# Patient Record
Sex: Female | Born: 1960 | Race: Black or African American | Hispanic: No | Smoking: Never smoker
Health system: Southern US, Community
[De-identification: ages and names within clinical notes are randomized; demographics above are authoritative.]

## PROBLEM LIST (undated history)

## (undated) DIAGNOSIS — E7800 Pure hypercholesterolemia, unspecified: Secondary | ICD-10-CM

## (undated) HISTORY — PX: FRACTURE SURGERY: SHX138

---

## 2005-06-07 ENCOUNTER — Ambulatory Visit (HOSPITAL_COMMUNITY): Admission: RE | Admit: 2005-06-07 | Discharge: 2005-06-07 | Payer: Self-pay | Admitting: *Deleted

## 2006-09-25 ENCOUNTER — Ambulatory Visit (HOSPITAL_COMMUNITY): Admission: RE | Admit: 2006-09-25 | Discharge: 2006-09-25 | Payer: Self-pay | Admitting: Obstetrics & Gynecology

## 2007-09-12 ENCOUNTER — Ambulatory Visit (HOSPITAL_COMMUNITY): Admission: RE | Admit: 2007-09-12 | Discharge: 2007-09-12 | Payer: Self-pay | Admitting: Ophthalmology

## 2008-07-03 ENCOUNTER — Ambulatory Visit (HOSPITAL_COMMUNITY): Admission: RE | Admit: 2008-07-03 | Discharge: 2008-07-03 | Payer: Self-pay | Admitting: Family Medicine

## 2008-07-14 ENCOUNTER — Encounter: Admission: RE | Admit: 2008-07-14 | Discharge: 2008-07-14 | Payer: Self-pay | Admitting: Family Medicine

## 2009-07-23 ENCOUNTER — Other Ambulatory Visit: Admission: RE | Admit: 2009-07-23 | Discharge: 2009-07-23 | Payer: Self-pay | Admitting: Family Medicine

## 2009-08-09 ENCOUNTER — Ambulatory Visit (HOSPITAL_COMMUNITY): Admission: RE | Admit: 2009-08-09 | Discharge: 2009-08-09 | Payer: Self-pay | Admitting: Family Medicine

## 2010-04-06 ENCOUNTER — Ambulatory Visit: Payer: Self-pay | Admitting: Obstetrics and Gynecology

## 2010-04-06 ENCOUNTER — Inpatient Hospital Stay (HOSPITAL_COMMUNITY)
Admission: AD | Admit: 2010-04-06 | Discharge: 2010-04-06 | Payer: Self-pay | Source: Home / Self Care | Admitting: Obstetrics & Gynecology

## 2010-05-02 ENCOUNTER — Encounter: Admission: RE | Admit: 2010-05-02 | Discharge: 2010-05-02 | Payer: Self-pay | Admitting: Family Medicine

## 2010-07-10 ENCOUNTER — Encounter: Payer: Self-pay | Admitting: Family Medicine

## 2010-08-31 LAB — URINALYSIS, ROUTINE W REFLEX MICROSCOPIC
Bilirubin Urine: NEGATIVE
Nitrite: NEGATIVE
Specific Gravity, Urine: >1.03 — ABNORMAL HIGH (ref 1.005–1.030)
Urobilinogen, UA: 0.2 mg/dL (ref 0.0–1.0)
pH: 5.5 (ref 5.0–8.0)

## 2010-08-31 LAB — URINE MICROSCOPIC-ADD ON

## 2010-08-31 LAB — CBC
MCH: 28.1 pg (ref 26.0–34.0)
MCHC: 33.4 g/dL (ref 30.0–36.0)
Platelets: 339 10*3/uL (ref 150–400)
RDW: 14.5 % (ref 11.5–15.5)

## 2010-08-31 LAB — POCT PREGNANCY, URINE: Preg Test, Ur: NEGATIVE

## 2010-10-18 ENCOUNTER — Other Ambulatory Visit (HOSPITAL_COMMUNITY)
Admission: RE | Admit: 2010-10-18 | Discharge: 2010-10-18 | Disposition: A | Discharge status: Home / Self Care | Payer: BC Managed Care – PPO | Source: Ambulatory Visit | Attending: Family Medicine | Admitting: Family Medicine

## 2010-10-18 DIAGNOSIS — Z01419 Encounter for gynecological examination (general) (routine) without abnormal findings: Secondary | ICD-10-CM | POA: Insufficient documentation

## 2010-11-15 ENCOUNTER — Other Ambulatory Visit (HOSPITAL_COMMUNITY): Payer: Self-pay | Admitting: Family Medicine

## 2010-11-15 DIAGNOSIS — Z1231 Encounter for screening mammogram for malignant neoplasm of breast: Secondary | ICD-10-CM

## 2010-12-23 ENCOUNTER — Ambulatory Visit (HOSPITAL_COMMUNITY): Payer: BC Managed Care – PPO

## 2011-02-10 ENCOUNTER — Ambulatory Visit (HOSPITAL_COMMUNITY)
Admission: RE | Admit: 2011-02-10 | Discharge: 2011-02-10 | Disposition: A | Discharge status: Home / Self Care | Payer: BC Managed Care – PPO | Source: Ambulatory Visit | Attending: Family Medicine | Admitting: Family Medicine

## 2011-02-10 DIAGNOSIS — Z1231 Encounter for screening mammogram for malignant neoplasm of breast: Secondary | ICD-10-CM

## 2011-05-10 ENCOUNTER — Other Ambulatory Visit: Payer: Self-pay | Admitting: Family Medicine

## 2011-05-10 ENCOUNTER — Ambulatory Visit
Admission: RE | Admit: 2011-05-10 | Discharge: 2011-05-10 | Disposition: A | Discharge status: Home / Self Care | Payer: Managed Care, Other (non HMO) | Source: Ambulatory Visit | Attending: Family Medicine | Admitting: Family Medicine

## 2011-05-10 DIAGNOSIS — R52 Pain, unspecified: Secondary | ICD-10-CM

## 2011-11-16 ENCOUNTER — Other Ambulatory Visit (HOSPITAL_COMMUNITY)
Admission: RE | Admit: 2011-11-16 | Discharge: 2011-11-16 | Disposition: A | Discharge status: Home / Self Care | Payer: BC Managed Care – PPO | Source: Ambulatory Visit | Attending: Family Medicine | Admitting: Family Medicine

## 2011-11-16 DIAGNOSIS — Z113 Encounter for screening for infections with a predominantly sexual mode of transmission: Secondary | ICD-10-CM | POA: Insufficient documentation

## 2011-11-16 DIAGNOSIS — Z1159 Encounter for screening for other viral diseases: Secondary | ICD-10-CM | POA: Insufficient documentation

## 2011-11-16 DIAGNOSIS — Z01419 Encounter for gynecological examination (general) (routine) without abnormal findings: Secondary | ICD-10-CM | POA: Insufficient documentation

## 2012-02-23 ENCOUNTER — Other Ambulatory Visit: Payer: Self-pay | Admitting: Family Medicine

## 2012-02-23 DIAGNOSIS — N920 Excessive and frequent menstruation with regular cycle: Secondary | ICD-10-CM

## 2012-03-08 ENCOUNTER — Other Ambulatory Visit: Payer: BC Managed Care – PPO

## 2012-07-22 ENCOUNTER — Other Ambulatory Visit (HOSPITAL_COMMUNITY): Payer: Self-pay | Admitting: Family Medicine

## 2012-07-22 DIAGNOSIS — Z1231 Encounter for screening mammogram for malignant neoplasm of breast: Secondary | ICD-10-CM

## 2012-08-02 ENCOUNTER — Ambulatory Visit (HOSPITAL_COMMUNITY): Payer: BC Managed Care – PPO

## 2012-08-23 ENCOUNTER — Ambulatory Visit (HOSPITAL_COMMUNITY): Payer: BC Managed Care – PPO

## 2012-09-05 ENCOUNTER — Ambulatory Visit (HOSPITAL_COMMUNITY)
Admission: RE | Admit: 2012-09-05 | Discharge: 2012-09-05 | Disposition: A | Discharge status: Home / Self Care | Payer: BC Managed Care – HMO | Source: Ambulatory Visit | Attending: Family Medicine | Admitting: Family Medicine

## 2012-09-05 DIAGNOSIS — Z1231 Encounter for screening mammogram for malignant neoplasm of breast: Secondary | ICD-10-CM

## 2013-04-02 ENCOUNTER — Other Ambulatory Visit (HOSPITAL_COMMUNITY)
Admission: RE | Admit: 2013-04-02 | Discharge: 2013-04-02 | Disposition: A | Discharge status: Home / Self Care | Payer: BC Managed Care – PPO | Source: Ambulatory Visit | Attending: Family Medicine | Admitting: Family Medicine

## 2013-04-02 DIAGNOSIS — N760 Acute vaginitis: Secondary | ICD-10-CM | POA: Insufficient documentation

## 2013-04-02 DIAGNOSIS — Z113 Encounter for screening for infections with a predominantly sexual mode of transmission: Secondary | ICD-10-CM | POA: Insufficient documentation

## 2013-08-22 ENCOUNTER — Other Ambulatory Visit (HOSPITAL_COMMUNITY): Payer: Self-pay | Admitting: Family Medicine

## 2013-08-22 DIAGNOSIS — Z1231 Encounter for screening mammogram for malignant neoplasm of breast: Secondary | ICD-10-CM

## 2013-10-28 ENCOUNTER — Ambulatory Visit (HOSPITAL_COMMUNITY): Payer: BC Managed Care – PPO

## 2013-11-21 ENCOUNTER — Ambulatory Visit (HOSPITAL_COMMUNITY)
Admission: RE | Admit: 2013-11-21 | Discharge: 2013-11-21 | Disposition: A | Discharge status: Home / Self Care | Payer: BC Managed Care – HMO | Source: Ambulatory Visit | Attending: Family Medicine | Admitting: Family Medicine

## 2013-11-21 DIAGNOSIS — Z803 Family history of malignant neoplasm of breast: Secondary | ICD-10-CM | POA: Insufficient documentation

## 2013-11-21 DIAGNOSIS — Z1231 Encounter for screening mammogram for malignant neoplasm of breast: Secondary | ICD-10-CM

## 2014-10-14 ENCOUNTER — Other Ambulatory Visit (HOSPITAL_COMMUNITY)
Admission: RE | Admit: 2014-10-14 | Discharge: 2014-10-14 | Disposition: A | Discharge status: Home / Self Care | Payer: BLUE CROSS/BLUE SHIELD | Source: Ambulatory Visit | Attending: Family Medicine | Admitting: Family Medicine

## 2014-10-14 DIAGNOSIS — Z01419 Encounter for gynecological examination (general) (routine) without abnormal findings: Secondary | ICD-10-CM | POA: Diagnosis not present

## 2014-10-14 DIAGNOSIS — Z1151 Encounter for screening for human papillomavirus (HPV): Secondary | ICD-10-CM | POA: Insufficient documentation

## 2014-11-30 ENCOUNTER — Other Ambulatory Visit (HOSPITAL_COMMUNITY): Payer: Self-pay | Admitting: Family Medicine

## 2014-11-30 DIAGNOSIS — Z1231 Encounter for screening mammogram for malignant neoplasm of breast: Secondary | ICD-10-CM

## 2014-12-02 ENCOUNTER — Ambulatory Visit (HOSPITAL_COMMUNITY)
Admission: RE | Admit: 2014-12-02 | Discharge: 2014-12-02 | Disposition: A | Discharge status: Home / Self Care | Payer: BLUE CROSS/BLUE SHIELD | Source: Ambulatory Visit | Attending: Family Medicine | Admitting: Family Medicine

## 2014-12-02 DIAGNOSIS — Z1231 Encounter for screening mammogram for malignant neoplasm of breast: Secondary | ICD-10-CM | POA: Insufficient documentation

## 2015-11-01 ENCOUNTER — Other Ambulatory Visit: Payer: Self-pay

## 2015-11-01 DIAGNOSIS — Z1231 Encounter for screening mammogram for malignant neoplasm of breast: Secondary | ICD-10-CM

## 2015-12-17 ENCOUNTER — Ambulatory Visit
Admission: RE | Admit: 2015-12-17 | Discharge: 2015-12-17 | Disposition: A | Discharge status: Home / Self Care | Payer: BLUE CROSS/BLUE SHIELD | Source: Ambulatory Visit

## 2015-12-17 DIAGNOSIS — Z1231 Encounter for screening mammogram for malignant neoplasm of breast: Secondary | ICD-10-CM

## 2016-11-03 ENCOUNTER — Ambulatory Visit (INDEPENDENT_AMBULATORY_CARE_PROVIDER_SITE_OTHER): Payer: BLUE CROSS/BLUE SHIELD

## 2016-11-03 ENCOUNTER — Encounter (HOSPITAL_COMMUNITY): Payer: Self-pay

## 2016-11-03 ENCOUNTER — Ambulatory Visit (HOSPITAL_COMMUNITY)
Admission: EM | Admit: 2016-11-03 | Discharge: 2016-11-03 | Disposition: A | Discharge status: Home / Self Care | Payer: BLUE CROSS/BLUE SHIELD | Attending: Internal Medicine | Admitting: Internal Medicine

## 2016-11-03 DIAGNOSIS — W19XXXA Unspecified fall, initial encounter: Secondary | ICD-10-CM

## 2016-11-03 DIAGNOSIS — S300XXA Contusion of lower back and pelvis, initial encounter: Secondary | ICD-10-CM

## 2016-11-03 NOTE — ED Triage Notes (Signed)
Pt was mopping the floor and slipped in the kitchen a week ago and fell on her tailbone and now the pain is getting worse. Would like a xray and said it hurts when she stands up out of a chair. Has been taking tylenol with small relief along with epsom baths.

## 2016-11-03 NOTE — ED Provider Notes (Signed)
CSN: [id number]     Arrival date & time 11/03/16  1732 History   First MD Initiated Contact with Patient 11/03/16 1839     Chief Complaint  Patient presents with  . Fall   (Consider location/radiation/quality/duration/timing/severity/associated sxs/prior Treatment) As per nursing note this 56 year old female was mopping the floor and she slipped and fell onto her "tailbone and back side". This occurred one week ago. She has pain when sitting and during the act of standing. It also hurts when she leans forward. The point of pain is over the lower sacrococcygeal area. She also states that her buttocks hurt with sitting as well. Denies lower extremity weakness or numbness. Denies other injury. She has an appointment with her PCP on Tuesday, 4 days but wanted to get an x-ray sooner.      History reviewed. No pertinent past medical history. History reviewed. No pertinent surgical history. No family history on file. Social History  Substance Use Topics  . Smoking status: Never Smoker  . Smokeless tobacco: Never Used  . Alcohol use No   OB History    No data available     Review of Systems  Constitutional: Positive for activity change.  Respiratory: Negative.   Cardiovascular: Negative.   Gastrointestinal: Negative.   Musculoskeletal:       As per history of present illness  Skin: Negative.   Neurological: Negative.   All other systems reviewed and are negative.   Allergies  Penicillins  Home Medications   Prior to Admission medications   Not on File   Meds Ordered and Administered this Visit  Medications - No data to display  BP 140/77 (BP Location: Left Arm)   Pulse 86   Temp 98.5 F (36.9 C) (Oral)   Resp 20   LMP 10/06/2016 (Within Days)   SpO2 100%  No data found.   Physical Exam  Constitutional: She is oriented to person, place, and time. She appears well-developed and well-nourished. No distress.  HENT:  Head: Normocephalic and atraumatic.  Eyes: EOM  are normal.  Neck: Normal range of motion. Neck supple.  Cardiovascular: Normal rate.   Pulmonary/Chest: Effort normal.  Musculoskeletal: She exhibits tenderness. She exhibits no edema or deformity.  Tenderness to the mid lower sacrum and coccygeal area. Tenderness surrounding the mid sacrum. No tenderness to the muscles of the buttock or paralumbar musculature. She is able to bend forward to about 30 but experiences pain in the sacral coccygeal area.  Neurological: She is alert and oriented to person, place, and time. No cranial nerve deficit.  Skin: Skin is warm and dry.  Psychiatric: She has a normal mood and affect.  Nursing note and vitals reviewed.   Urgent Care Course     Procedures (including critical care time)  Labs Review Labs Reviewed - No data to display  Imaging Review Dg Sacrum/coccyx  Result Date: 11/03/2016 CLINICAL DATA:  Larey SeatFell on tail bone with continued pain EXAM: SACRUM AND COCCYX - 2+ VIEW COMPARISON:  None. FINDINGS: There is no evidence of fracture or other focal bone lesions. IMPRESSION: Negative. Electronically Signed   By: Jasmine PangKim  Fujinaga M.D.   On: 11/03/2016 19:21     Visual Acuity Review  Right Eye Distance:   Left Eye Distance:   Bilateral Distance:    Right Eye Near:   Left Eye Near:    Bilateral Near:         MDM   1. Fall, initial encounter   2. Coccygeal contusion, initial encounter  3. Contusion of buttock, initial encounter     Apply ice to the area pain. If comfortable while sitting you may want to try a donut pillow. X-ray negative for any fracture. Tylenol and/or ibuprofen for pain as needed. Follow-up with your primary care doctor as needed.    Hayden Rasmussen, NP 11/03/16 1949

## 2016-11-03 NOTE — Discharge Instructions (Signed)
Apply ice to the area pain. If comfortable while sitting you may want to try a donut pillow. X-ray negative for any fracture. Tylenol and/or ibuprofen for pain as needed. Follow-up with your primary care doctor as needed.

## 2017-05-18 ENCOUNTER — Other Ambulatory Visit (HOSPITAL_COMMUNITY)
Admission: RE | Admit: 2017-05-18 | Discharge: 2017-05-18 | Disposition: A | Discharge status: Home / Self Care | Payer: BLUE CROSS/BLUE SHIELD | Source: Ambulatory Visit | Attending: Family Medicine | Admitting: Family Medicine

## 2017-05-18 ENCOUNTER — Other Ambulatory Visit: Payer: Self-pay | Admitting: Family Medicine

## 2017-05-18 DIAGNOSIS — Z124 Encounter for screening for malignant neoplasm of cervix: Secondary | ICD-10-CM | POA: Insufficient documentation

## 2017-05-23 LAB — CYTOLOGY - PAP
Adequacy: ABSENT
BACTERIAL VAGINITIS: POSITIVE — AB
CANDIDA VAGINITIS: NEGATIVE
CHLAMYDIA, DNA PROBE: NEGATIVE
DIAGNOSIS: NEGATIVE
HPV (WINDOPATH): NOT DETECTED
Neisseria Gonorrhea: NEGATIVE
Trichomonas: NEGATIVE

## 2018-01-11 ENCOUNTER — Other Ambulatory Visit: Payer: Self-pay | Admitting: Family Medicine

## 2018-01-11 DIAGNOSIS — Z1231 Encounter for screening mammogram for malignant neoplasm of breast: Secondary | ICD-10-CM

## 2018-02-11 ENCOUNTER — Ambulatory Visit
Admission: RE | Admit: 2018-02-11 | Discharge: 2018-02-11 | Disposition: A | Discharge status: Home / Self Care | Payer: BLUE CROSS/BLUE SHIELD | Source: Ambulatory Visit | Attending: Family Medicine | Admitting: Family Medicine

## 2018-02-11 DIAGNOSIS — Z1231 Encounter for screening mammogram for malignant neoplasm of breast: Secondary | ICD-10-CM

## 2018-06-19 HISTORY — PX: OTHER SURGICAL HISTORY: SHX169

## 2018-08-19 ENCOUNTER — Other Ambulatory Visit: Payer: Self-pay | Admitting: Family Medicine

## 2018-08-19 ENCOUNTER — Other Ambulatory Visit (HOSPITAL_COMMUNITY)
Admission: RE | Admit: 2018-08-19 | Discharge: 2018-08-19 | Disposition: A | Discharge status: Home / Self Care | Payer: Medicaid Other | Source: Ambulatory Visit | Attending: Family Medicine | Admitting: Family Medicine

## 2018-08-19 DIAGNOSIS — Z113 Encounter for screening for infections with a predominantly sexual mode of transmission: Secondary | ICD-10-CM | POA: Diagnosis present

## 2018-08-19 DIAGNOSIS — Z01411 Encounter for gynecological examination (general) (routine) with abnormal findings: Secondary | ICD-10-CM | POA: Insufficient documentation

## 2018-08-22 LAB — CYTOLOGY - PAP
Chlamydia: NEGATIVE
Diagnosis: NEGATIVE
Neisseria Gonorrhea: NEGATIVE
Trichomonas: NEGATIVE

## 2019-04-07 ENCOUNTER — Other Ambulatory Visit: Payer: Self-pay | Admitting: Family Medicine

## 2019-04-07 DIAGNOSIS — Z1231 Encounter for screening mammogram for malignant neoplasm of breast: Secondary | ICD-10-CM

## 2019-05-22 ENCOUNTER — Other Ambulatory Visit: Payer: Self-pay

## 2019-05-22 ENCOUNTER — Ambulatory Visit
Admission: RE | Admit: 2019-05-22 | Discharge: 2019-05-22 | Disposition: A | Discharge status: Home / Self Care | Payer: Medicaid Other | Source: Ambulatory Visit | Attending: Family Medicine | Admitting: Family Medicine

## 2019-05-22 DIAGNOSIS — Z1231 Encounter for screening mammogram for malignant neoplasm of breast: Secondary | ICD-10-CM

## 2019-05-28 ENCOUNTER — Emergency Department (HOSPITAL_COMMUNITY): Payer: Self-pay

## 2019-05-28 ENCOUNTER — Other Ambulatory Visit: Payer: Self-pay

## 2019-05-28 ENCOUNTER — Emergency Department (HOSPITAL_COMMUNITY)
Admission: EM | Admit: 2019-05-28 | Discharge: 2019-05-28 | Disposition: A | Discharge status: Home / Self Care | Payer: Self-pay | Attending: Emergency Medicine | Admitting: Emergency Medicine

## 2019-05-28 ENCOUNTER — Encounter (HOSPITAL_COMMUNITY): Payer: Self-pay

## 2019-05-28 DIAGNOSIS — R072 Precordial pain: Secondary | ICD-10-CM | POA: Insufficient documentation

## 2019-05-28 DIAGNOSIS — R200 Anesthesia of skin: Secondary | ICD-10-CM | POA: Insufficient documentation

## 2019-05-28 DIAGNOSIS — M542 Cervicalgia: Secondary | ICD-10-CM | POA: Insufficient documentation

## 2019-05-28 DIAGNOSIS — E041 Nontoxic single thyroid nodule: Secondary | ICD-10-CM

## 2019-05-28 DIAGNOSIS — R202 Paresthesia of skin: Secondary | ICD-10-CM

## 2019-05-28 HISTORY — DX: Pure hypercholesterolemia, unspecified: E78.00

## 2019-05-28 LAB — TROPONIN I (HIGH SENSITIVITY)
Troponin I (High Sensitivity): 3 ng/L (ref <18)
Troponin I (High Sensitivity): 2 ng/L (ref <18)

## 2019-05-28 LAB — I-STAT BETA HCG BLOOD, ED (MC, WL, AP ONLY): I-stat hCG, quantitative: <5 m[IU]/mL (ref <5)

## 2019-05-28 LAB — BASIC METABOLIC PANEL
Anion gap: 7 (ref 5–15)
BUN: 11 mg/dL (ref 6–20)
CO2: 28 mmol/L (ref 22–32)
Calcium: 9.3 mg/dL (ref 8.9–10.3)
Chloride: 104 mmol/L (ref 98–111)
Creatinine, Ser: 0.83 mg/dL (ref 0.44–1.00)
GFR calc Af Amer: >60 mL/min (ref >60)
GFR calc non Af Amer: >60 mL/min (ref >60)
Glucose, Bld: 110 mg/dL — ABNORMAL HIGH (ref 70–99)
Potassium: 3.9 mmol/L (ref 3.5–5.1)
Sodium: 139 mmol/L (ref 135–145)

## 2019-05-28 LAB — CBC
HCT: 43.1 % (ref 36.0–46.0)
Hemoglobin: 13.4 g/dL (ref 12.0–15.0)
MCH: 27.6 pg (ref 26.0–34.0)
MCHC: 31.1 g/dL (ref 30.0–36.0)
MCV: 88.7 fL (ref 80.0–100.0)
Platelets: 303 10*3/uL (ref 150–400)
RBC: 4.86 MIL/uL (ref 3.87–5.11)
RDW: 13.6 % (ref 11.5–15.5)
WBC: 5.7 10*3/uL (ref 4.0–10.5)
nRBC: 0 % (ref 0.0–0.2)

## 2019-05-28 MED ORDER — SODIUM CHLORIDE 0.9% FLUSH
3.0000 mL | Freq: Once | INTRAVENOUS | Status: AC
  Administered 2019-05-28: 08:00:00 3 mL via INTRAVENOUS

## 2019-05-28 NOTE — ED Notes (Signed)
Patient transported to MRI 

## 2019-05-28 NOTE — Discharge Instructions (Signed)
You were seen in the emergency department today with chest pain along with neck discomfort.  Your MRI showed some mild degenerative disc disease but no compression of your spinal cord.  The MRI did show a nodule on your thyroid which needs to have an ultrasound.  Please call your primary care doctor to schedule the next available follow-up appointment to discuss this finding as well as your pain symptoms.  Return to the emergency department with any new or suddenly worsening symptoms.

## 2019-05-28 NOTE — ED Notes (Addendum)
MD at bedside to update patient

## 2019-05-28 NOTE — ED Triage Notes (Signed)
Pt c/o chest pain since 0300. Pt also c/o neck stiffness and numbness down her left arm.

## 2019-05-28 NOTE — ED Notes (Signed)
Spoke to MRI. They are ready for patient but, need someone to transport patient to MRI.

## 2019-05-28 NOTE — ED Provider Notes (Signed)
Emergency Department Provider Note   I have reviewed the triage vital signs and the nursing notes.   HISTORY  Chief Complaint Chest Pain   HPI Katrina Luna is a 58 y.o. female with PMH of HLD presents to the ED with chest discomfort starting this AM at approximately 4 AM.  Patient describes the pain as sharp but occasionally has pressure.  Radiates into her left arm with some numbness in both hands.  She is also having some pain in the left side of her neck.  Denies any fevers, cough, shortness of breath.  No similar symptoms in the past.  No history of CAD.  No injury to the head or neck.  No weakness in the arms or legs. No radiation of symptoms or other modifying factors. No known history of DVT/PE.    Past Medical History:  Diagnosis Date  . High cholesterol     There are no active problems to display for this patient.   History reviewed. No pertinent surgical history.  Allergies Penicillins  Family History  Problem Relation Age of Onset  . Breast cancer Mother 21  . Breast cancer Maternal Grandmother     Social History Social History   Tobacco Use  . Smoking status: Never Smoker  . Smokeless tobacco: Never Used  Substance Use Topics  . Alcohol use: No  . Drug use: No    Review of Systems  Constitutional: No fever/chills Eyes: No visual changes. ENT: No sore throat. Cardiovascular: Positive chest pain. Respiratory: Denies shortness of breath. Gastrointestinal: Mild epigastric abdominal pain.  No nausea, no vomiting.  No diarrhea.  No constipation. Genitourinary: Negative for dysuria. Musculoskeletal: Negative for back pain. Positive left arm and left lateral neck pain.  Skin: Negative for rash. Neurological: Negative for headaches, focal weakness or numbness.  10-point ROS otherwise negative.  ____________________________________________   PHYSICAL EXAM:  VITAL SIGNS: ED Triage Vitals [05/28/19 0719]  Enc Vitals Group     BP (!) 157/90   Pulse Rate 88     Resp 18     Temp 99.3 F (37.4 C)     Temp Source Oral     SpO2 100 %     Weight 225 lb (102.1 kg)   Constitutional: Alert and oriented. Well appearing and in no acute distress. Eyes: Conjunctivae are normal. Head: Atraumatic. Nose: No congestion/rhinnorhea. Mouth/Throat: Mucous membranes are moist.  Neck: No stridor. Cardiovascular: Normal rate, regular rhythm. Good peripheral circulation. Grossly normal heart sounds.   Respiratory: Normal respiratory effort.  No retractions. Lungs CTAB. Gastrointestinal: Soft and nontender. No distention.  Musculoskeletal: No lower extremity tenderness nor edema. No gross deformities of extremities. Neurologic:  Normal speech and language. Normal strength. No pronator drift. Subjective decreased sensation to light touch over the left lateral hand. Normal grip strength.  Skin:  Skin is warm, dry and intact. No rash noted.  ____________________________________________   LABS (all labs ordered are listed, but only abnormal results are displayed)  Labs Reviewed  BASIC METABOLIC PANEL - Abnormal; Notable for the following components:      Result Value   Glucose, Bld 110 (*)    All other components within normal limits  CBC  I-STAT BETA HCG BLOOD, ED ([facility], [facility], [facility] ONLY)  TROPONIN I (HIGH SENSITIVITY)  TROPONIN I (HIGH SENSITIVITY)   ____________________________________________  EKG   EKG Interpretation  Date/Time:  Wednesday May 28 2019 07:17:18 EST Ventricular Rate:  88 PR Interval:    QRS Duration: 99 QT Interval:  392 QTC Calculation: 480 R Axis:   -15 Text Interpretation: Sinus rhythm Borderline left axis deviation Low voltage, precordial leads Consider anterior infarct Baseline wander in lead(s) II III aVL aVF V2 V3 V4 V5 V6 No STEMI Confirmed by Nanda Quinton [phone]) on 05/28/2019 7:45:44 AM       ____________________________________________  RADIOLOGY  Dg Chest 2 View  Result Date: 05/28/2019 CLINICAL  DATA:  Chest pain. Additional history provided: Neck stiffness and numbness down left arm and jaw pain. EXAM: CHEST - 2 VIEW COMPARISON:  Chest radiograph 05/10/2011 FINDINGS: Heart size within normal limits. There is no airspace consolidation within the lungs. No evidence of pleural effusion or pneumothorax. No acute bony abnormality. IMPRESSION: No evidence of acute cardiopulmonary abnormality. Electronically Signed   By: Kellie Simmering DO   On: 05/28/2019 07:43   Mr Cervical Spine Wo Contrast  Result Date: 05/28/2019 CLINICAL DATA:  Neck stiffness and left arm numbness EXAM: MRI CERVICAL SPINE WITHOUT CONTRAST TECHNIQUE: Multiplanar, multisequence MR imaging of the cervical spine was performed. No intravenous contrast was administered. COMPARISON:  None. FINDINGS: Alignment: Nonspecific straightening of the cervical lordosis. Anteroposterior alignment is maintained. Vertebrae: Vertebral body heights are maintained. There is no marrow edema or suspicious osseous lesion. Cord: Normal caliber and signal. Posterior Fossa, vertebral arteries, paraspinal tissues: Enlarged left thyroid with dominant nodule measuring 2.5 cm. Disc levels: C2-C3:  No canal or foraminal stenosis. C3-C4: Left facet hypertrophy. No significant canal or foraminal stenosis. C4-C5: Minimal disc bulge and facet hypertrophy. No significant canal or foraminal stenosis. C5-C6: Minimal disc bulge and facet and uncovertebral hypertrophy. No significant canal or left foraminal stenosis. Minor right foraminal stenosis. C6-C7: Minimal disc bulge, endplate osteophytes, and uncovertebral hypertrophy. Minor canal and foraminal stenosis. C7-T1:  No canal or foraminal stenosis. IMPRESSION: Mild degenerative changes without high-grade stenosis. Dominant left thyroid nodule measuring 2.5 cm. Thyroid ultrasound is recommended if not previously performed. Electronically Signed   By: Macy Mis M.D.   On: 05/28/2019 12:46     ____________________________________________   PROCEDURES  Procedure(s) performed:   Procedures  None ____________________________________________   INITIAL IMPRESSION / ASSESSMENT AND PLAN / ED COURSE  Pertinent labs & imaging results that were available during my care of the patient were reviewed by me and considered in my medical decision making (see chart for details).   Patient presents to the emergency department for evaluation of rather atypical chest pain which is sharp and pressure like at times.  Very low suspicion for ACS, PE, aortic pathology.  I am sending initial troponins.  Patient has no midline cervical spine tenderness and no injury.  Doubt infectious process here.  The symptoms in the left arm seem somewhat radicular with subjective decreased sensation to light touch in the left hand.   Labs, CXR, and MRI reviewed. No acute process. Thyroid nodule discussed and will f/u with PCP for outpatient [place]. Discussed ED return precautions.  ____________________________________________  FINAL CLINICAL IMPRESSION(S) / ED DIAGNOSES  Final diagnoses:  Precordial chest pain  Neck pain  Paresthesias in left hand  Thyroid nodule     MEDICATIONS GIVEN DURING THIS VISIT:  Medications  sodium chloride flush (NS) 0.9 % injection 3 mL (3 mLs Intravenous Given 05/28/19 0749)     Note:  This document was prepared using Dragon voice recognition software and may include unintentional dictation errors.  Nanda Quinton, MD, [facility]    Ashleigh Luckow, [facility], MD 05/28/19 [phone]

## 2019-05-28 NOTE — ED Notes (Addendum)
Patient called out for water. Informed patient she is going to MRI and will need to wait.

## 2019-09-25 ENCOUNTER — Ambulatory Visit: Payer: Medicaid Other | Attending: Family

## 2019-09-25 DIAGNOSIS — Z23 Encounter for immunization: Secondary | ICD-10-CM

## 2019-09-25 NOTE — Progress Notes (Signed)
   Covid-19 Vaccination Clinic  Name:  Katrina Luna    MRN: [id number] DOB: [birth year]  09/25/2019  [name] was observed post Covid-19 immunization for 15 minutes without incident. She was provided with Vaccine Information Sheet and instruction to access the V-Safe system.   [name] was instructed to call 911 with any severe reactions post vaccine: Marland Kitchen Difficulty breathing  . Swelling of face and throat  . A fast heartbeat  . A bad rash all over body  . Dizziness and weakness   Immunizations Administered    Name Date Dose VIS Date Route   Moderna COVID-19 Vaccine 09/25/2019 10:29 AM 0.5 mL 05/20/2019 Intramuscular   Manufacturer: Moderna   Lot: [id number]   NDC: 52080-223-36

## 2019-10-16 ENCOUNTER — Other Ambulatory Visit: Payer: Self-pay | Admitting: Family Medicine

## 2019-10-16 ENCOUNTER — Ambulatory Visit
Admission: RE | Admit: 2019-10-16 | Discharge: 2019-10-16 | Disposition: A | Discharge status: Home / Self Care | Payer: Medicaid Other | Source: Ambulatory Visit | Attending: Family Medicine | Admitting: Family Medicine

## 2019-10-16 DIAGNOSIS — R591 Generalized enlarged lymph nodes: Secondary | ICD-10-CM

## 2019-10-16 DIAGNOSIS — Z7185 Encounter for immunization safety counseling: Secondary | ICD-10-CM

## 2019-10-16 DIAGNOSIS — Z7189 Other specified counseling: Secondary | ICD-10-CM

## 2019-10-28 ENCOUNTER — Ambulatory Visit: Payer: Medicaid Other

## 2019-11-04 ENCOUNTER — Ambulatory Visit: Payer: Medicaid Other | Attending: Family

## 2019-11-04 DIAGNOSIS — Z23 Encounter for immunization: Secondary | ICD-10-CM

## 2019-11-04 NOTE — Progress Notes (Signed)
   Covid-19 Vaccination Clinic  Name:  Katrina Luna    MRN: [id number] DOB: 1960-09-20  11/04/2019  [name] was observed post Covid-19 immunization for 30 minutes based on pre-vaccination screening without incident. She was provided with Vaccine Information Sheet and instruction to access the V-Safe system.   [name] was instructed to call 911 with any severe reactions post vaccine: Marland Kitchen Difficulty breathing  . Swelling of face and throat  . A fast heartbeat  . A bad rash all over body  . Dizziness and weakness   Immunizations Administered    Name Date Dose VIS Date Route   Moderna COVID-19 Vaccine 11/04/2019 12:57 PM 0.5 mL 05/2019 Intramuscular   Manufacturer: Moderna   Lot: [id number]   NDC: 17510-258-52

## 2020-04-07 ENCOUNTER — Other Ambulatory Visit: Payer: Self-pay | Admitting: Nurse Practitioner

## 2020-04-07 ENCOUNTER — Other Ambulatory Visit (HOSPITAL_COMMUNITY)
Admission: RE | Admit: 2020-04-07 | Discharge: 2020-04-07 | Disposition: A | Discharge status: Home / Self Care | Payer: Medicaid Other | Source: Ambulatory Visit | Attending: Nurse Practitioner | Admitting: Nurse Practitioner

## 2020-04-07 DIAGNOSIS — Z124 Encounter for screening for malignant neoplasm of cervix: Secondary | ICD-10-CM | POA: Diagnosis present

## 2020-04-12 LAB — CYTOLOGY - PAP
Chlamydia: NEGATIVE
Comment: NEGATIVE
Comment: NEGATIVE
Comment: NEGATIVE
Comment: NEGATIVE
Comment: NORMAL
Diagnosis: NEGATIVE
HSV1: NEGATIVE
HSV2: NEGATIVE
High risk HPV: NEGATIVE
Neisseria Gonorrhea: NEGATIVE
Trichomonas: NEGATIVE

## 2020-10-04 ENCOUNTER — Other Ambulatory Visit (HOSPITAL_COMMUNITY): Payer: Self-pay | Admitting: Family Medicine

## 2020-10-04 DIAGNOSIS — M79604 Pain in right leg: Secondary | ICD-10-CM

## 2020-10-04 DIAGNOSIS — M7989 Other specified soft tissue disorders: Secondary | ICD-10-CM

## 2020-10-05 ENCOUNTER — Other Ambulatory Visit: Payer: Self-pay

## 2020-10-05 ENCOUNTER — Ambulatory Visit (HOSPITAL_COMMUNITY)
Admission: RE | Admit: 2020-10-05 | Discharge: 2020-10-05 | Disposition: A | Discharge status: Home / Self Care | Payer: No Typology Code available for payment source | Source: Ambulatory Visit | Attending: Family Medicine | Admitting: Family Medicine

## 2020-10-05 DIAGNOSIS — M79604 Pain in right leg: Secondary | ICD-10-CM | POA: Insufficient documentation

## 2020-10-05 DIAGNOSIS — M7989 Other specified soft tissue disorders: Secondary | ICD-10-CM

## 2020-10-05 NOTE — Progress Notes (Signed)
Lower extremity venous RT study completed.   Please see CV Proc for preliminary results.   Fatimata Talsma, RDMS, RVT  

## 2021-05-30 ENCOUNTER — Other Ambulatory Visit: Payer: Self-pay | Admitting: Family Medicine

## 2021-05-30 DIAGNOSIS — Z1231 Encounter for screening mammogram for malignant neoplasm of breast: Secondary | ICD-10-CM

## 2021-06-29 ENCOUNTER — Ambulatory Visit: Payer: No Typology Code available for payment source

## 2021-07-21 ENCOUNTER — Encounter (HOSPITAL_COMMUNITY): Payer: Self-pay | Admitting: Emergency Medicine

## 2021-07-21 ENCOUNTER — Ambulatory Visit (HOSPITAL_COMMUNITY)
Admission: EM | Admit: 2021-07-21 | Discharge: 2021-07-21 | Disposition: A | Discharge status: Home / Self Care | Payer: No Typology Code available for payment source | Attending: Family Medicine | Admitting: Family Medicine

## 2021-07-21 ENCOUNTER — Other Ambulatory Visit: Payer: Self-pay

## 2021-07-21 DIAGNOSIS — L723 Sebaceous cyst: Secondary | ICD-10-CM | POA: Diagnosis not present

## 2021-07-21 DIAGNOSIS — L03114 Cellulitis of left upper limb: Secondary | ICD-10-CM

## 2021-07-21 MED ORDER — SULFAMETHOXAZOLE-TRIMETHOPRIM 800-160 MG PO TABS: 1.0000 | ORAL_TABLET | Freq: Two times a day (BID) | ORAL | 0 refills | Status: AC

## 2021-07-21 NOTE — ED Provider Notes (Signed)
[facility]    CSN: [id number] Arrival date & time: 07/21/21  1032      History   Chief Complaint Chief Complaint  Patient presents with   Abscess    HPI Katrina Luna is a 61 y.o. female.   Patient has had a lump at the left anterior shoulder x several months.  She hit it while showering, and irritated it. Her fiance tried put  needle into it to try to drain it.  Nothing came out, it is a bit flattened, but now is more irritated, painful, red.  Pain down that left arm as well.   Past Medical History:  Diagnosis Date   High cholesterol     There are no problems to display for this patient.   History reviewed. No pertinent surgical history.  OB History   No obstetric history on file.      Home Medications    Prior to Admission medications   Medication Sig Start Date End Date Taking? Authorizing Provider  Ascorbic Acid (VITAMIN C PO) Take 1 tablet by mouth daily.    [provider]  Flaxseed, Linseed, (FLAX SEEDS PO) Take 1 tablet by mouth daily.    [provider]  Omega-3 Fatty Acids (FISH OIL OMEGA-3 PO) Take 1 capsule by mouth daily.    [provider]  rosuvastatin (CRESTOR) 10 MG tablet Take 10 mg by mouth daily. 04/24/19   [provider]  VITAMIN D PO Take 1 tablet by mouth daily.    [provider]    Family History Family History  Problem Relation Age of Onset   Breast cancer Mother 53   Breast cancer Maternal Grandmother     Social History Social History   Tobacco Use   Smoking status: Never   Smokeless tobacco: Never  Vaping Use   Vaping Use: Never used  Substance Use Topics   Alcohol use: No   Drug use: No     Allergies   Penicillins   Review of Systems Review of Systems  Constitutional: Negative.   HENT: Negative.    Respiratory: Negative.    Cardiovascular: Negative.   Skin:  Positive for color change.    Physical Exam Triage Vital Signs ED Triage Vitals  Enc Vitals  Group     BP 07/21/21 1101 134/76     Pulse Rate 07/21/21 1101 79     Resp 07/21/21 1101 20     Temp 07/21/21 1101 98.9 F (37.2 C)     Temp Source 07/21/21 1101 Oral     SpO2 07/21/21 1101 98 %     Weight --      Height --      Head Circumference --      Peak Flow --      Pain Score 07/21/21 1058 8     Pain Loc --      Pain Edu? --      Excl. in [place]? --    No data found.  Updated Vital Signs BP 134/76 (BP Location: Right Arm) Comment (BP Location): large cuff   Pulse 79    Temp 98.9 F (37.2 C) (Oral)    Resp 20    LMP 10/06/2016 (Within Days)    SpO2 98%   Visual Acuity Right Eye Distance:   Left Eye Distance:   Bilateral Distance:    Right Eye Near:   Left Eye Near:    Bilateral Near:     Physical Exam Constitutional:  Appearance: Normal appearance.  Skin:    Comments: At the left anterior shoulder is a an area that is slightly raised, with erythema, tenderness;  small cyst felt deep in the skin, but nothing to the surface such as fluctuance.  There is a small puncture wound just above this area.  No drainage noted.   Neurological:     Mental Status: She is alert.     UC Treatments / Results  Labs (all labs ordered are listed, but only abnormal results are displayed) Labs Reviewed - No data to display  EKG   Radiology No results found.  Procedures Procedures (including critical care time)  Medications Ordered in UC Medications - No data to display  Initial Impression / Assessment and Plan / UC Course  I have reviewed the triage vital signs and the nursing notes.  Pertinent labs & imaging results that were available during my care of the patient were reviewed by me and considered in my medical decision making (see chart for details).   Patient was seen today for possible abscess.  I think she has a sebaceous cyst, that she and her partner tried to treat by poking a needle in it.  This may have caused the cyst to pop internally.  AT this time there  is redness, tenderness, and a small lump deep to the skin.  There does not appear to be anything at the surface to open and drain.  Discussed that today I would given her an abx for possible cellulitis, but we would have to monitor this over time for the possibility of removal/ I&D.  She will return if this appears to worsen, or comes to a head at the skin.   Final Clinical Impressions(s) / UC Diagnoses   Final diagnoses:  Cellulitis of left upper extremity  Sebaceous cyst     Discharge Instructions      You were seen today for likely infected sebaceous cyst.  At this time, this time we cannot open and drain this.  I have sent out an antibiotic to take twice/day x 7 days.  I also recommend warm compresses.  As discussed, if the cyst comes more to the surface, and appears ready to open, then you may return for possible incision and drainage.     ED Prescriptions     Medication Sig Dispense Auth. Provider   sulfamethoxazole-trimethoprim (BACTRIM DS) 800-160 MG tablet Take 1 tablet by mouth 2 (two) times daily for 7 days. 14 tablet Rondel Oh, MD      PDMP not reviewed this encounter.   Rondel Oh, MD 07/21/21 1137

## 2021-07-21 NOTE — ED Triage Notes (Signed)
Noticed bump on left shoulder 5 months ago.  site had become irritating.  Bump was pricked with a needle.  Now its more painful and red and left arm is painful

## 2021-07-21 NOTE — Discharge Instructions (Signed)
You were seen today for likely infected sebaceous cyst.  At this time, this time we cannot open and drain this.  I have sent out an antibiotic to take twice/day x 7 days.  I also recommend warm compresses.  As discussed, if the cyst comes more to the surface, and appears ready to open, then you may return for possible incision and drainage.

## 2021-08-01 ENCOUNTER — Ambulatory Visit
Admission: RE | Admit: 2021-08-01 | Discharge: 2021-08-01 | Disposition: A | Discharge status: Home / Self Care | Payer: No Typology Code available for payment source | Source: Ambulatory Visit | Attending: Family Medicine | Admitting: Family Medicine

## 2021-08-01 DIAGNOSIS — Z1231 Encounter for screening mammogram for malignant neoplasm of breast: Secondary | ICD-10-CM

## 2021-10-04 ENCOUNTER — Other Ambulatory Visit: Payer: Self-pay | Admitting: Surgery

## 2022-06-06 ENCOUNTER — Other Ambulatory Visit: Payer: Self-pay | Admitting: Family Medicine

## 2022-06-06 DIAGNOSIS — Z1231 Encounter for screening mammogram for malignant neoplasm of breast: Secondary | ICD-10-CM

## 2022-06-16 ENCOUNTER — Other Ambulatory Visit: Payer: Self-pay | Admitting: Obstetrics and Gynecology

## 2022-06-16 DIAGNOSIS — E049 Nontoxic goiter, unspecified: Secondary | ICD-10-CM

## 2022-07-05 IMAGING — MG MM DIGITAL SCREENING BILAT W/ TOMO AND CAD
8 series · 8 of 24 positions shown · non-contrast
Comparison: Previous exam(s).

CLINICAL DATA: Screening.

EXAM:
DIGITAL SCREENING BILATERAL MAMMOGRAM WITH TOMOSYNTHESIS AND CAD
TECHNIQUE: Bilateral screening digital craniocaudal and mediolateral oblique
mammograms were obtained. Bilateral screening digital breast
tomosynthesis was performed. The images were evaluated with
computer-aided detection.

[R MLO synth-2D]
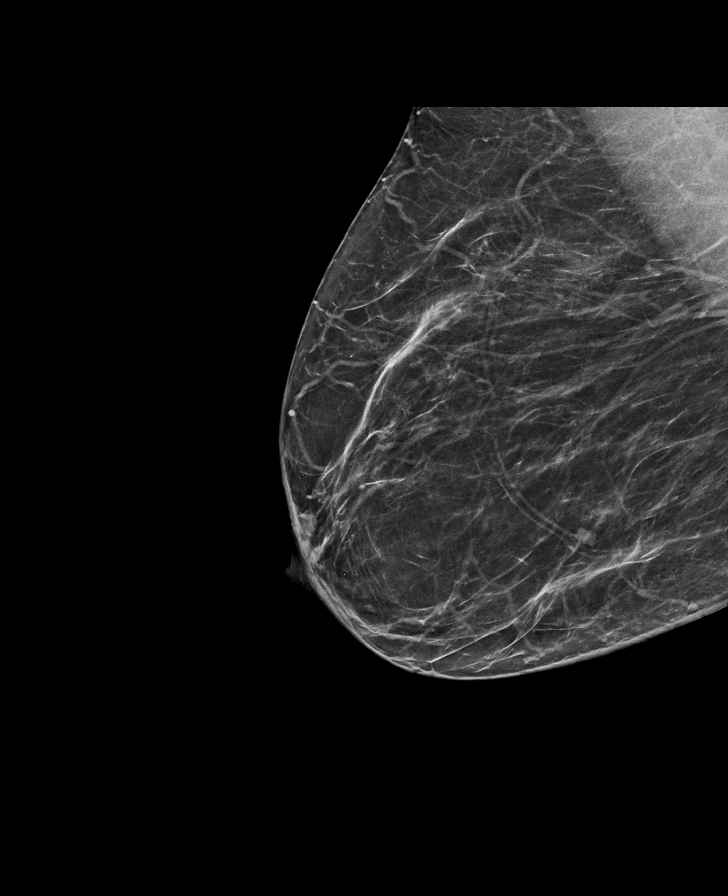

[R CC synth-2D]
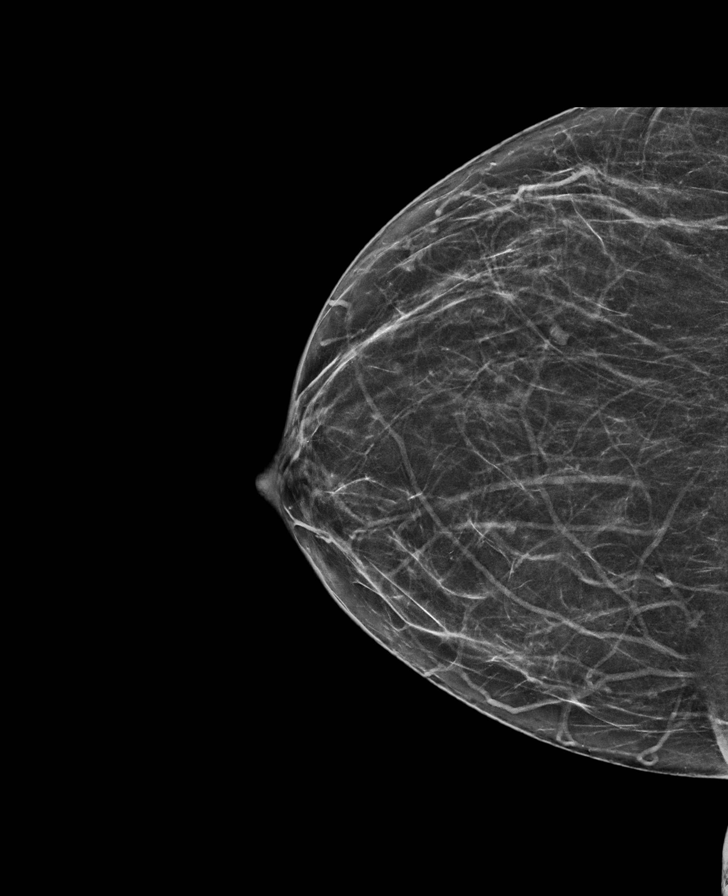

[L MLO synth-2D]
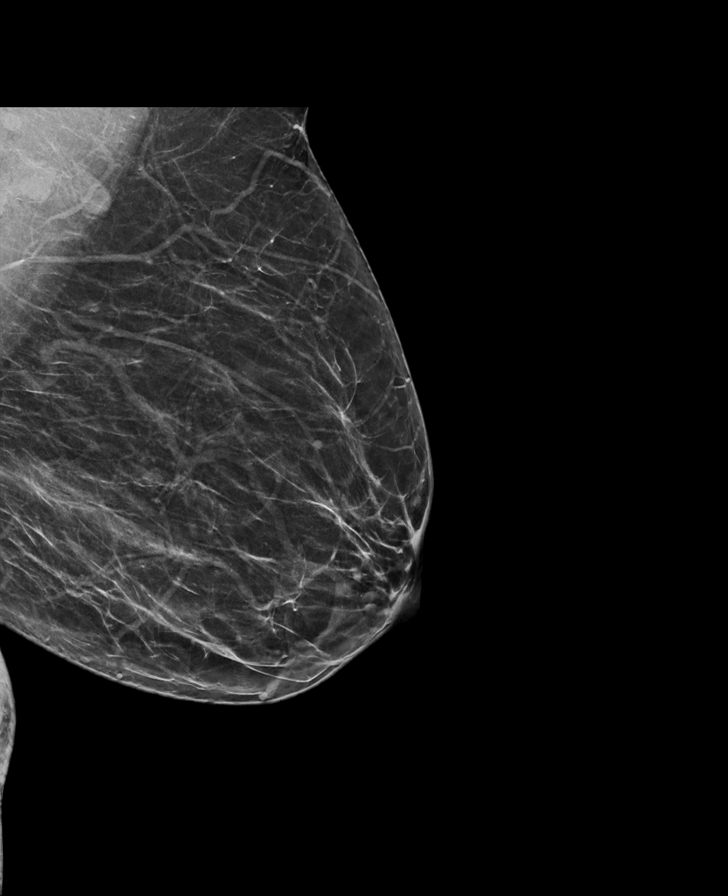

[L CC synth-2D]
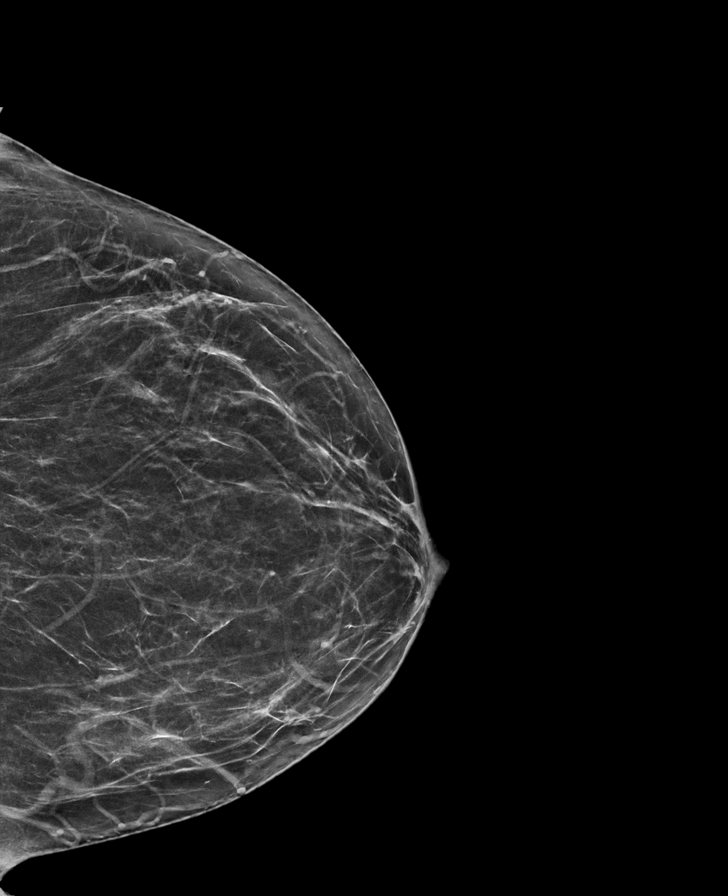

[R MLO tomo · tomo slice 35/70.0]
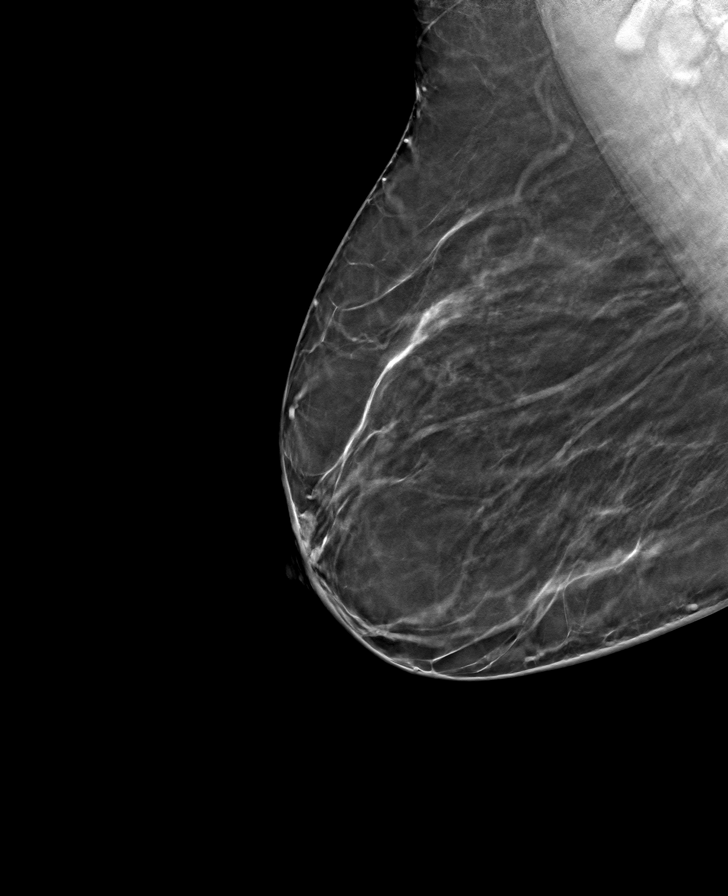

[L MLO tomo · tomo slice 37/73.0]
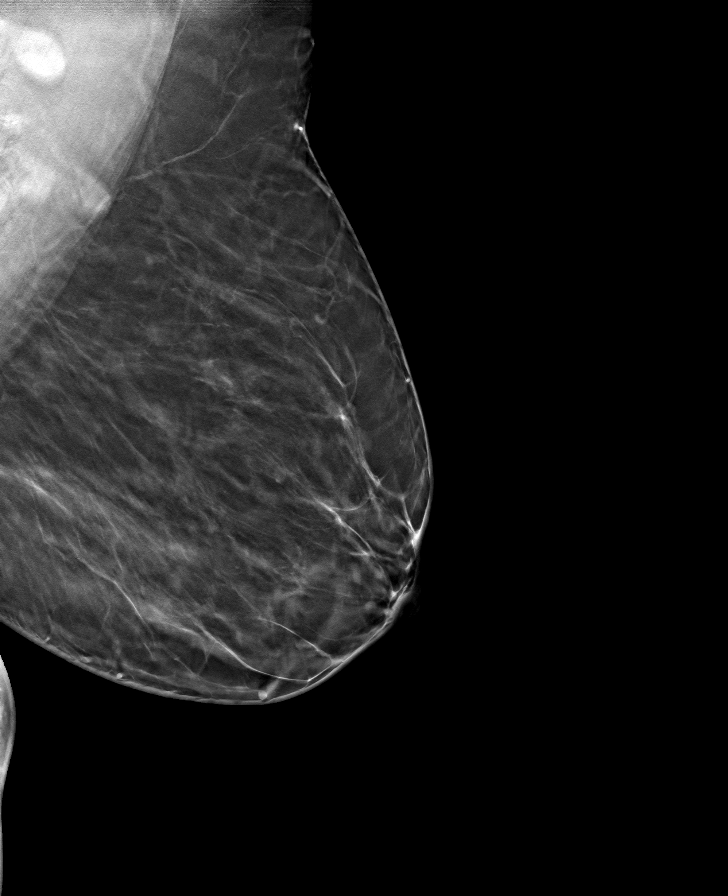

[L CC tomo · tomo slice 32/63.0]
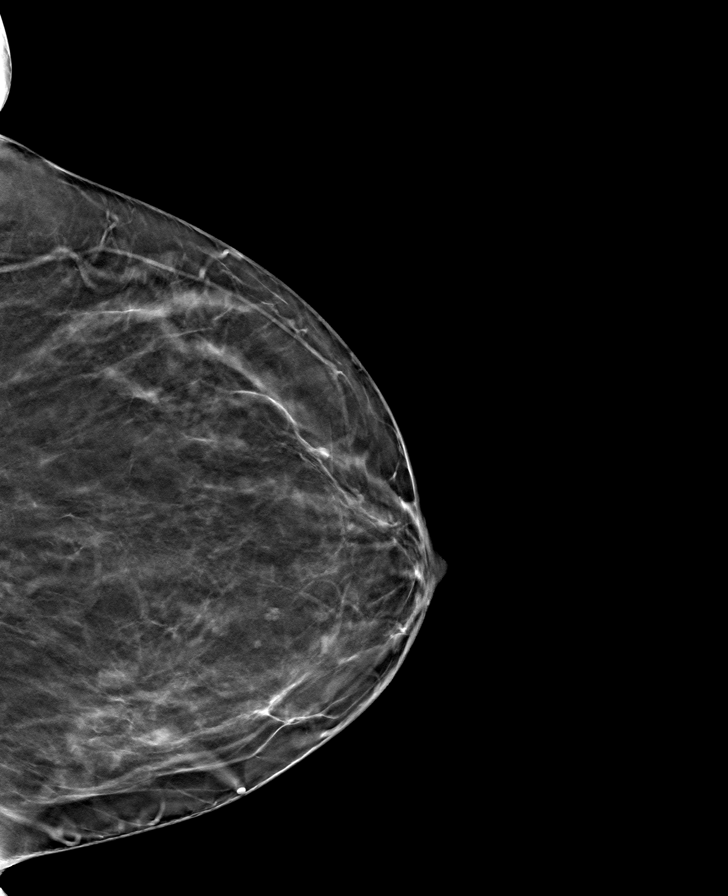

[R CC tomo · tomo slice 31/60.0]
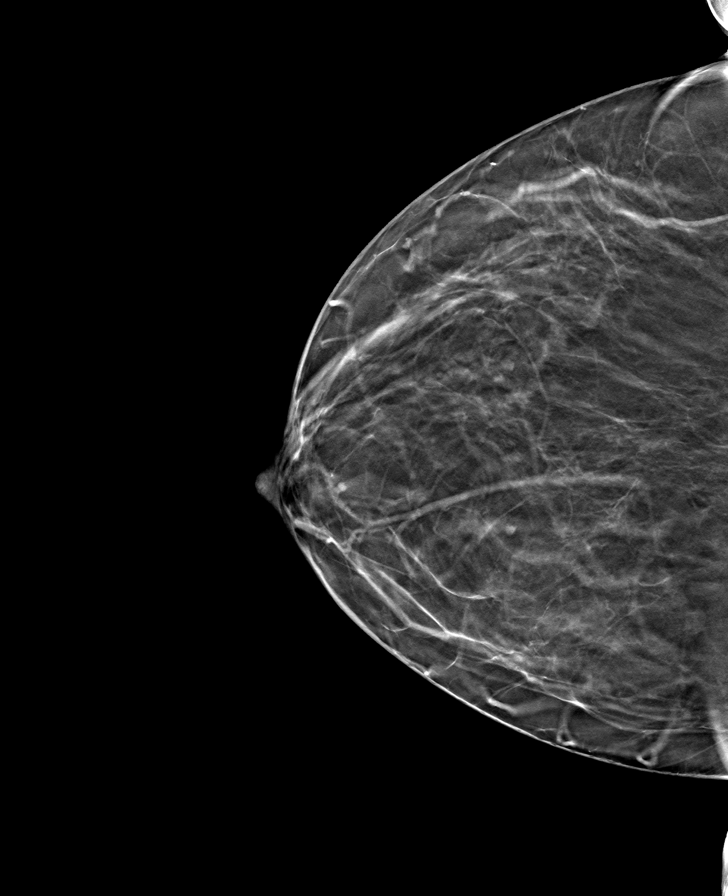

[8 of 24 positions shown; findings below may reference images not displayed]

ACR Breast Density Category b: There are scattered areas of
fibroglandular density.
FINDINGS: There are no findings suspicious for malignancy.
IMPRESSION: No mammographic evidence of malignancy. A result letter of this
screening mammogram will be mailed directly to the patient.

RECOMMENDATION:
Screening mammogram in one year. (Code:51-O-LD2)

BI-RADS CATEGORY  1: Negative.

## 2022-07-06 LAB — COLOGUARD: COLOGUARD: NEGATIVE

## 2022-08-03 ENCOUNTER — Other Ambulatory Visit: Payer: No Typology Code available for payment source

## 2022-08-03 ENCOUNTER — Ambulatory Visit: Payer: No Typology Code available for payment source

## 2022-08-18 ENCOUNTER — Ambulatory Visit: Payer: No Typology Code available for payment source

## 2022-08-24 ENCOUNTER — Ambulatory Visit
Admission: RE | Admit: 2022-08-24 | Discharge: 2022-08-24 | Disposition: A | Discharge status: Home / Self Care | Payer: No Typology Code available for payment source | Source: Ambulatory Visit | Attending: Obstetrics and Gynecology | Admitting: Obstetrics and Gynecology

## 2022-08-24 DIAGNOSIS — E049 Nontoxic goiter, unspecified: Secondary | ICD-10-CM

## 2022-09-07 ENCOUNTER — Encounter (HOSPITAL_COMMUNITY): Payer: Self-pay | Admitting: Family Medicine

## 2022-09-07 DIAGNOSIS — E041 Nontoxic single thyroid nodule: Secondary | ICD-10-CM

## 2022-09-11 ENCOUNTER — Other Ambulatory Visit: Payer: Self-pay | Admitting: Family Medicine

## 2022-09-11 DIAGNOSIS — E041 Nontoxic single thyroid nodule: Secondary | ICD-10-CM

## 2022-09-22 ENCOUNTER — Ambulatory Visit
Admission: RE | Admit: 2022-09-22 | Discharge: 2022-09-22 | Disposition: A | Discharge status: Home / Self Care | Payer: No Typology Code available for payment source | Source: Ambulatory Visit | Attending: Family Medicine | Admitting: Family Medicine

## 2022-09-22 DIAGNOSIS — Z1231 Encounter for screening mammogram for malignant neoplasm of breast: Secondary | ICD-10-CM

## 2022-10-10 ENCOUNTER — Other Ambulatory Visit (HOSPITAL_COMMUNITY)
Admission: RE | Admit: 2022-10-10 | Discharge: 2022-10-10 | Disposition: A | Discharge status: Home / Self Care | Payer: No Typology Code available for payment source | Source: Ambulatory Visit | Attending: Internal Medicine | Admitting: Internal Medicine

## 2022-10-10 ENCOUNTER — Ambulatory Visit
Admission: RE | Admit: 2022-10-10 | Discharge: 2022-10-10 | Disposition: A | Discharge status: Home / Self Care | Payer: No Typology Code available for payment source | Source: Ambulatory Visit | Attending: Family Medicine | Admitting: Family Medicine

## 2022-10-10 DIAGNOSIS — E041 Nontoxic single thyroid nodule: Secondary | ICD-10-CM

## 2022-10-10 NOTE — Procedures (Signed)
Successful US guided FNA of left mid thyroid nodule No complications. See PACS for full report.    Alex Gardener, AGNP-BC 10/10/2022, 2:54 PM

## 2022-10-12 LAB — CYTOLOGY - NON PAP

## 2022-10-24 ENCOUNTER — Encounter (HOSPITAL_COMMUNITY): Payer: Self-pay

## 2023-10-11 ENCOUNTER — Other Ambulatory Visit: Payer: Self-pay | Admitting: Family Medicine

## 2023-10-11 DIAGNOSIS — Z1231 Encounter for screening mammogram for malignant neoplasm of breast: Secondary | ICD-10-CM

## 2023-11-07 ENCOUNTER — Ambulatory Visit
Admission: RE | Admit: 2023-11-07 | Discharge: 2023-11-07 | Disposition: A | Discharge status: Home / Self Care | Source: Ambulatory Visit | Attending: Family Medicine | Admitting: Family Medicine

## 2023-11-07 DIAGNOSIS — Z1231 Encounter for screening mammogram for malignant neoplasm of breast: Secondary | ICD-10-CM

## 2024-01-25 ENCOUNTER — Other Ambulatory Visit: Payer: Self-pay | Admitting: Obstetrics and Gynecology

## 2024-03-03 ENCOUNTER — Encounter (HOSPITAL_COMMUNITY): Payer: Self-pay | Admitting: Obstetrics and Gynecology

## 2024-03-03 NOTE — Progress Notes (Signed)
 Spoke w/ via phone for pre-op interview--- Katrina Luna needs dos----  CBC per surgeon.        Luna results------ COVID test -----patient states asymptomatic no test needed Arrive at -------1100 NPO after MN NO Solid Food.  Clear liquids from MN until---1000 Pre-Surgery Ensure or G2:  Med rec completed Medications to take morning of surgery -----NONE Diabetic medication -----  GLP1 agonist last dose: GLP1 instructions:  Patient instructed no nail polish to be worn day of surgery Patient instructed to bring photo id and insurance card day of surgery Patient aware to have Driver (ride ) / caregiver    for 24 hours after surgery - Son [facility] Patient Special Instructions ----- Pre-Op special Instructions -----  Patient verbalized understanding of instructions that were given at this phone interview. Patient denies chest pain, sob, fever, cough at the interview.

## 2024-03-07 ENCOUNTER — Other Ambulatory Visit: Payer: Self-pay

## 2024-03-07 ENCOUNTER — Encounter (HOSPITAL_COMMUNITY)
Admission: RE | Disposition: A | Discharge status: Home / Self Care | Payer: Self-pay | Source: Home / Self Care | Attending: Obstetrics and Gynecology

## 2024-03-07 ENCOUNTER — Ambulatory Visit (HOSPITAL_COMMUNITY)
Admission: RE | Admit: 2024-03-07 | Discharge: 2024-03-07 | Disposition: A | Discharge status: Home / Self Care | Attending: Obstetrics and Gynecology | Admitting: Obstetrics and Gynecology

## 2024-03-07 ENCOUNTER — Ambulatory Visit (HOSPITAL_COMMUNITY)

## 2024-03-07 ENCOUNTER — Encounter (HOSPITAL_COMMUNITY): Payer: Self-pay | Admitting: Obstetrics and Gynecology

## 2024-03-07 DIAGNOSIS — N952 Postmenopausal atrophic vaginitis: Secondary | ICD-10-CM | POA: Diagnosis not present

## 2024-03-07 DIAGNOSIS — N840 Polyp of corpus uteri: Secondary | ICD-10-CM | POA: Insufficient documentation

## 2024-03-07 DIAGNOSIS — N950 Postmenopausal bleeding: Secondary | ICD-10-CM | POA: Insufficient documentation

## 2024-03-07 DIAGNOSIS — D250 Submucous leiomyoma of uterus: Secondary | ICD-10-CM | POA: Diagnosis not present

## 2024-03-07 HISTORY — PX: HYSTEROSCOPY WITH MYOMECTOMY: SHX7591

## 2024-03-07 HISTORY — PX: DILATATION & CURRETTAGE/HYSTEROSCOPY WITH RESECTOCOPE: SHX5572

## 2024-03-07 LAB — CBC
HCT: 38 % (ref 36.0–46.0)
Hemoglobin: 12.2 g/dL (ref 12.0–15.0)
MCH: 28.4 pg (ref 26.0–34.0)
MCHC: 32.1 g/dL (ref 30.0–36.0)
MCV: 88.6 fL (ref 80.0–100.0)
Platelets: 248 K/uL (ref 150–400)
RBC: 4.29 MIL/uL (ref 3.87–5.11)
RDW: 13.1 % (ref 11.5–15.5)
WBC: 4.4 K/uL (ref 4.0–10.5)
nRBC: 0 % (ref 0.0–0.2)

## 2024-03-07 SURGERY — HYSTEROSCOPY WITH MYOMECTOMY
Anesthesia: General | Site: Vagina

## 2024-03-07 MED ORDER — ONDANSETRON HCL 4 MG/2ML IJ SOLN
INTRAMUSCULAR | Status: DC | PRN
  Administered 2024-03-07: 4 mg via INTRAVENOUS

## 2024-03-07 MED ORDER — LACTATED RINGERS IV SOLN: INTRAVENOUS | Status: DC

## 2024-03-07 MED ORDER — FENTANYL CITRATE (PF) 250 MCG/5ML IJ SOLN
INTRAMUSCULAR | Status: AC
  Filled 2024-03-07: qty 5

## 2024-03-07 MED ORDER — POVIDONE-IODINE 10 % EX SWAB: 2.0000 | Freq: Once | CUTANEOUS | Status: DC

## 2024-03-07 MED ORDER — KETOROLAC TROMETHAMINE 15 MG/ML IJ SOLN
INTRAMUSCULAR | Status: DC | PRN
  Administered 2024-03-07: 30 mg via INTRAVENOUS

## 2024-03-07 MED ORDER — DEXAMETHASONE SODIUM PHOSPHATE 10 MG/ML IJ SOLN
INTRAMUSCULAR | Status: AC
  Filled 2024-03-07: qty 1

## 2024-03-07 MED ORDER — ONDANSETRON HCL 4 MG/2ML IJ SOLN
INTRAMUSCULAR | Status: AC
  Filled 2024-03-07: qty 2

## 2024-03-07 MED ORDER — SODIUM CHLORIDE 0.9 % IR SOLN
Status: DC | PRN
  Administered 2024-03-07: 3000 mL via INTRAVESICAL

## 2024-03-07 MED ORDER — PROPOFOL 10 MG/ML IV BOLUS
INTRAVENOUS | Status: AC
Start: 2024-03-07 — End: 2024-03-07
  Filled 2024-03-07: qty 20

## 2024-03-07 MED ORDER — FENTANYL CITRATE (PF) 250 MCG/5ML IJ SOLN
INTRAMUSCULAR | Status: DC | PRN
  Administered 2024-03-07 (×2): 25 ug via INTRAVENOUS

## 2024-03-07 MED ORDER — DEXAMETHASONE SODIUM PHOSPHATE 10 MG/ML IJ SOLN
INTRAMUSCULAR | Status: DC | PRN
  Administered 2024-03-07: 4 mg via INTRAVENOUS

## 2024-03-07 MED ORDER — CHLORHEXIDINE GLUCONATE 0.12 % MT SOLN
OROMUCOSAL | Status: AC
  Filled 2024-03-07: qty 15

## 2024-03-07 MED ORDER — IBUPROFEN 800 MG PO TABS: 800.0000 mg | ORAL_TABLET | Freq: Three times a day (TID) | ORAL | 0 refills | Status: AC | PRN

## 2024-03-07 MED ORDER — DEXMEDETOMIDINE HCL IN NACL 80 MCG/20ML IV SOLN
INTRAVENOUS | Status: AC
  Filled 2024-03-07: qty 20

## 2024-03-07 MED ORDER — ORAL CARE MOUTH RINSE: 15.0000 mL | Freq: Once | OROMUCOSAL | Status: AC

## 2024-03-07 MED ORDER — MIDAZOLAM HCL 2 MG/2ML IJ SOLN
INTRAMUSCULAR | Status: DC | PRN
  Administered 2024-03-07: 2 mg via INTRAVENOUS

## 2024-03-07 MED ORDER — CHLORHEXIDINE GLUCONATE 0.12 % MT SOLN
15.0000 mL | Freq: Once | OROMUCOSAL | Status: AC
  Administered 2024-03-07: 15 mL via OROMUCOSAL

## 2024-03-07 MED ORDER — ACETAMINOPHEN 500 MG PO TABS
ORAL_TABLET | ORAL | Status: AC
  Filled 2024-03-07: qty 2

## 2024-03-07 MED ORDER — MIDAZOLAM HCL 2 MG/2ML IJ SOLN
INTRAMUSCULAR | Status: AC
  Filled 2024-03-07: qty 2

## 2024-03-07 MED ORDER — PROPOFOL 10 MG/ML IV BOLUS
INTRAVENOUS | Status: DC | PRN
  Administered 2024-03-07: 200 mg via INTRAVENOUS

## 2024-03-07 MED ORDER — LIDOCAINE 2% (20 MG/ML) 5 ML SYRINGE
INTRAMUSCULAR | Status: DC | PRN
  Administered 2024-03-07: 100 mg via INTRAVENOUS

## 2024-03-07 MED ORDER — LIDOCAINE 2% (20 MG/ML) 5 ML SYRINGE
INTRAMUSCULAR | Status: AC
  Filled 2024-03-07: qty 5

## 2024-03-07 MED ORDER — EPHEDRINE SULFATE-NACL 50-0.9 MG/10ML-% IV SOSY
PREFILLED_SYRINGE | INTRAVENOUS | Status: DC | PRN
  Administered 2024-03-07: 10 mg via INTRAVENOUS

## 2024-03-07 MED ORDER — KETOROLAC TROMETHAMINE 30 MG/ML IJ SOLN
INTRAMUSCULAR | Status: AC
  Filled 2024-03-07: qty 1

## 2024-03-07 MED ORDER — ACETAMINOPHEN 500 MG PO TABS
1000.0000 mg | ORAL_TABLET | Freq: Once | ORAL | Status: AC
  Administered 2024-03-07: 1000 mg via ORAL
  Filled 2024-03-07: qty 2

## 2024-03-07 MED ORDER — EPHEDRINE 5 MG/ML INJ
INTRAVENOUS | Status: AC
  Filled 2024-03-07: qty 5

## 2024-03-07 SURGICAL SUPPLY — 16 items
CATH ROBINSON RED A/P 16FR (CATHETERS) ×2 IMPLANT
DEVICE MYOSURE LITE (MISCELLANEOUS) IMPLANT
DEVICE MYOSURE REACH (MISCELLANEOUS) IMPLANT
DILATOR CANAL MILEX (MISCELLANEOUS) IMPLANT
GLOVE ECLIPSE 6.5 STRL STRAW (GLOVE) ×2 IMPLANT
GLOVE SURG UNDER POLY LF SZ7 (GLOVE) ×2 IMPLANT
GOWN STRL REUS W/ TWL LRG LVL3 (GOWN DISPOSABLE) ×2 IMPLANT
KIT PROCEDURE FLUENT (KITS) ×2 IMPLANT
KIT TURNOVER KIT B (KITS) ×2 IMPLANT
PACK VAGINAL MINOR WOMEN LF (CUSTOM PROCEDURE TRAY) ×2 IMPLANT
SEAL CERVICAL OMNI LOK (ABLATOR) IMPLANT
SEAL ROD LENS SCOPE MYOSURE (ABLATOR) ×2 IMPLANT
SOL .9 NS 3000ML IRR UROMATIC (IV SOLUTION) IMPLANT
SYSTEM TISS REMOVAL MYOSURE XL (MISCELLANEOUS) IMPLANT
TOWEL GREEN STERILE FF (TOWEL DISPOSABLE) ×2 IMPLANT
UNDERPAD 30X36 HEAVY ABSORB (UNDERPADS AND DIAPERS) ×2 IMPLANT

## 2024-03-07 NOTE — Op Note (Signed)
 NAMEAISHAH, Katrina Luna MEDICAL RECORD NO: [id number] ACCOUNT NO: [id number] DATE OF BIRTH: 07-Apr-1961 FACILITY: [facility] LOCATION: [facility] PHYSICIAN: Sayf Kerner A. Rutherford, MD  Operative Report   DATE OF PROCEDURE: 03/07/2024  PREOPERATIVE DIAGNOSES:  Postmenopausal bleeding, submucosal fibroid.  PROCEDURE:  Diagnostic hysteroscopy, hysteroscopic resection of submucosal fibroids and endometrial polyp using MyoSure, dilation and curettage.  POSTOPERATIVE DIAGNOSES:  Postmenopausal bleeding, submucosal fibroids, endometrial polyp.  ANESTHESIA:  General.  SURGEON:  Tyquez Hollibaugh A. Rutherford, MD  ASSISTANT:  None.  DESCRIPTION OF PROCEDURE:  Under adequate general anesthesia, the patient was placed in the dorsal lithotomy position.  She was sterilely prepped and draped in the usual fashion.  The patient had voided prior to entering the room, therefore, she was not  catheterized.  Examination under anesthesia revealed about a 10-week sized uterus.  No adnexal masses was palpable limited by the patient's body habitus.  A bivalve speculum was placed in the vagina.  A single-tooth tenaculum was placed on the anterior  lip of the cervix.  An atrophic vagina was noted.  The cervix was dilated up to a #17 Pratt dilator.  A diagnostic hysteroscope was introduced into the uterine cavity.  A large infarcted polyp was noted arising off the left anterior lateral  wall of the uterus.  A smaller polyp was noted at the entrance of the left tubal ostia.  Multiple small submucosal fibroids were noted.  Using the Reach resectoscope, the entire endometrial cavity including resection of the submucosal fibroids and  removal of the endometrial polyps was done.  When all tissue was felt to have been removed, the endocervical canal was inspected.  No lesions noted.  All instruments were then removed from the vagina.  SPECIMEN:  Labelled endometrial curetting with fibroid resection and endometrial polyp was sent to  pathology.  ESTIMATED BLOOD LOSS:  2 mL.  FLUID DEFICIT:  185 mL.  INTRAOPERATIVE FLUIDS:  400 mL.  COUNTS:  Sponge and instrument counts x2 was correct.  COMPLICATIONS:  None.  CONDITION:  The patient tolerated the procedure well, was transferred to recovery room in stable condition.   VAI D: 03/07/2024 2:26:39 pm T: 03/07/2024 9:35:00 pm  JOB: [id number]/ [id number]

## 2024-03-07 NOTE — Discharge Instructions (Addendum)
 CALL  IF TEMP>100.4, NOTHING PER VAGINA X 2 WK, CALL IF SOAKING A MAXI  PAD EVERY HOUR OR MORE FREQUENTLY           No acetaminophen /Tylenol  until after 7:00 pm today if needed.    Post Anesthesia Home Care Instructions  Activity: Get plenty of rest for the remainder of the day. A responsible individual must stay with you for 24 hours following the procedure.  For the next 24 hours, DO NOT: -Drive a car -Advertising copywriter -Drink alcoholic beverages -Take any medication unless instructed by your physician -Make any legal decisions or sign important papers.  Meals: Start with liquid foods such as gelatin or soup. Progress to regular foods as tolerated. Avoid greasy, spicy, heavy foods. If nausea and/or vomiting occur, drink only clear liquids until the nausea and/or vomiting subsides. Call your physician if vomiting continues.  Special Instructions/Symptoms: Your throat may feel dry or sore from the anesthesia or the breathing tube placed in your throat during surgery. If this causes discomfort, gargle with warm salt water. The discomfort should disappear within 24 hours.

## 2024-03-07 NOTE — Anesthesia Preprocedure Evaluation (Addendum)
 Anesthesia Evaluation  Patient identified by MRN, date of birth, ID band Patient awake    Reviewed: Allergy & Precautions, H&P , NPO status , Patient's Chart, lab work & pertinent test results  Airway Mallampati: II  TM Distance: >3 FB Neck ROM: Full    Dental no notable dental hx.    Pulmonary neg pulmonary ROS, neg COPD   Pulmonary exam normal breath sounds clear to auscultation       Cardiovascular (-) hypertension(-) Past MI Normal cardiovascular exam Rhythm:Regular Rate:Normal  HLD   Neuro/Psych neg Seizures negative neurological ROS  negative psych ROS   GI/Hepatic negative GI ROS, Neg liver ROS,,,  Endo/Other  negative endocrine ROS    Renal/GU negative Renal ROS  negative genitourinary   Musculoskeletal negative musculoskeletal ROS (+)    Abdominal   Peds negative pediatric ROS (+)  Hematology negative hematology ROS (+)   Anesthesia Other Findings Post menopausal bleeding  Reproductive/Obstetrics negative OB ROS                              Anesthesia Physical Anesthesia Plan  ASA: 2  Anesthesia Plan: General   Post-op Pain Management: Tylenol  PO (pre-op)*   Induction: Intravenous  PONV Risk Score and Plan: 3 and Ondansetron , Dexamethasone  and Treatment may vary due to age or medical condition  Airway Management Planned: LMA  Additional Equipment:   Intra-op Plan:   Post-operative Plan: Extubation in OR  Informed Consent: I have reviewed the patients History and Physical, chart, labs and discussed the procedure including the risks, benefits and alternatives for the proposed anesthesia with the patient or authorized representative who has indicated his/her understanding and acceptance.     Dental advisory given  Plan Discussed with: CRNA  Anesthesia Plan Comments:         Anesthesia Quick Evaluation

## 2024-03-07 NOTE — Interval H&P Note (Signed)
 History and Physical Interval Note:  03/07/2024 12:44 PM  Katrina Luna  has presented today for surgery, with the diagnosis of Post menopausal bleeding Submucosal fibroid.  The various methods of treatment have been discussed with the patient and family. After consideration of risks, benefits and other options for treatment, the patient has consented to  Procedure(s): HYSTEROSCOPY WITH MYOMECTOMY (N/A) DILATATION & CURETTAGE/HYSTEROSCOPY WITH MYOSURE RESECTION (N/A) as a surgical intervention.  The patient's history has been reviewed, patient examined, no change in status, stable for surgery.  I have reviewed the patient's chart and labs.  Questions were answered to the patient's satisfaction.     Ada Holness A Aerion Bagdasarian

## 2024-03-07 NOTE — Transfer of Care (Signed)
 Immediate Anesthesia Transfer of Care Note  Patient: Katrina Luna  Procedure(s) Performed: HYSTEROSCOPY WITH MYOMECTOMY DILATATION & CURETTAGE/HYSTEROSCOPY WITH MYOSURE RESECTION (Vagina )  Patient Location: PACU  Anesthesia Type:General  Level of Consciousness: awake, alert , and oriented  Airway & Oxygen Therapy: Patient Spontanous Breathing  Post-op Assessment: Report given to RN and Post -op Vital signs reviewed and stable  Post vital signs: Reviewed and stable  Last Vitals:  Vitals Value Taken Time  BP 117/54 03/07/24 14:28  Temp    Pulse    Resp 16 03/07/24 14:30  SpO2    Vitals shown include unfiled device data.  Last Pain:  Vitals:   03/07/24 1133  TempSrc: Oral  PainSc: 2       Patients Stated Pain Goal: 6 (03/07/24 1133)  Complications: No notable events documented.

## 2024-03-07 NOTE — Brief Op Note (Signed)
 03/07/2024  2:33 PM  PATIENT:  Katrina Luna  63 y.o. female  PRE-OPERATIVE DIAGNOSIS:  Post menopausal bleeding, submucosal fibroid Submucosal fibroid  POST-OPERATIVE DIAGNOSIS:  Post menopausal bleeding, Submucosal fibroid, endometrial polyp  PROCEDURE: Diagnostic hysteroscopy, hysteroscopic resection of endometrial polyp and submucosal fibroids using myosure, dilation and curettage  SURGEON:  Surgeons and Role:    * Rutherford Gain, MD - Primary  PHYSICIAN ASSISTANT:   ASSISTANTS: none   ANESTHESIA:   general Findings: large endometrial polyps,  SM fibroids left tubal ostia seen, right sclerosed EBL:  5 mL   BLOOD ADMINISTERED:none  DRAINS: none   LOCAL MEDICATIONS USED:  NONE  SPECIMEN:  Source of Specimen:  EMC with resection of fibroid and polyp  DISPOSITION OF SPECIMEN:  PATHOLOGY  COUNTS:  YES  TOURNIQUET:  * No tourniquets in log *  DICTATION: .Other Dictation: Dictation Number [id number]  PLAN OF CARE: Discharge to home after PACU  PATIENT DISPOSITION:  PACU - hemodynamically stable.   Delay start of Pharmacological VTE agent (>24hrs) due to surgical blood loss or risk of bleeding: no

## 2024-03-07 NOTE — Anesthesia Postprocedure Evaluation (Signed)
 Anesthesia Post Note  Patient: KLEO DUNGEE  Procedure(s) Performed: HYSTEROSCOPY WITH MYOMECTOMY DILATATION & CURETTAGE/HYSTEROSCOPY WITH MYOSURE RESECTION (Vagina )     Patient location during evaluation: PACU Anesthesia Type: General Level of consciousness: awake and alert Pain management: pain level controlled Vital Signs Assessment: post-procedure vital signs reviewed and stable Respiratory status: spontaneous breathing, nonlabored ventilation, respiratory function stable and patient connected to nasal cannula oxygen Cardiovascular status: blood pressure returned to baseline and stable Postop Assessment: no apparent nausea or vomiting Anesthetic complications: no   No notable events documented.  Last Vitals:  Vitals:   03/07/24 1133 03/07/24 1430  BP: (!) 145/75 126/78  Pulse: 84 90  Resp: 15 16  Temp: 36.9 C 36.7 C  SpO2: 97% 98%    Last Pain:  Vitals:   03/07/24 1430  TempSrc:   PainSc: 0-No pain                 Thom JONELLE Peoples

## 2024-03-07 NOTE — H&P (Signed)
 Katrina Luna is an 63 y.o. female. [id number] DF presents for surgical mgmt of recurrent PMB. Pt is scheduled for dx hysteroscopy, hysteroscopic resection of SM fibroid using myosure, D&C. Pt previously underwent sonogram and endometrial biopsy 02/2023 with findings c/w endometrial polyp. Pt has since then had recurrence of the bleeding and sonogram showed IM/SM fibroids  Pertinent Gynecological History: Menses: post-menopausal Bleeding: post menopausal bleeding Contraception: post menopausal status DES exposure: denies Blood transfusions: none Sexually transmitted diseases: no past history Previous GYN Procedures: none  Last mammogram: normal Date: 11/07/2023 Last pap: normal Date: 02/2023 OB History: G6, P4024   Menstrual History: Menarche age: n/a Patient's last menstrual period was 10/06/2016 (within days).    Past Medical History:  Diagnosis Date   High cholesterol     Past Surgical History:  Procedure Laterality Date   FRACTURE SURGERY     Wrist fracture repair as a child    Family History  Problem Relation Age of Onset   Breast cancer Mother 32   Breast cancer Maternal Grandmother     Social History:  reports that she has never smoked. She has never used smokeless tobacco. She reports that she does not drink alcohol and does not use drugs.  Allergies:  Allergies  Allergen Reactions   Penicillins Other (See Comments)     Did it involve swelling of the face/tongue/throat, SOB, or low BP? Yes Did it involve sudden or severe rash/hives, skin peeling, or any reaction on the inside of your mouth or nose? No Did you need to seek medical attention at a hospital or doctor's office? No When did it last happen?      2014 If all above answers are NO, may proceed with cephalosporin use.     No medications prior to admission.    Review of Systems  All other systems reviewed and are negative.   Height 5' 5.5 (1.664 m), weight 89.4 kg, last menstrual period  10/06/2016. Physical Exam Constitutional:      Appearance: Normal appearance. She is obese.  Eyes:     Extraocular Movements: Extraocular movements intact.  Cardiovascular:     Rate and Rhythm: Regular rhythm.     Heart sounds: Normal heart sounds.  Pulmonary:     Breath sounds: Normal breath sounds.  Abdominal:     Palpations: Abdomen is soft.  Genitourinary:    General: Normal vulva.     Comments: Vagina: no lesion Cervix parous Uterus sl enlarged Adnexa no palpable mass Musculoskeletal:        General: Normal range of motion.     Cervical back: Neck supple.  Skin:    General: Skin is warm and dry.  Neurological:     General: No focal deficit present.     Mental Status: She is alert and oriented to person, place, and time.  Psychiatric:        Mood and Affect: Mood normal.        Behavior: Behavior normal.     No results found for this or any previous visit (from the past 24 hours).  No results found.  Assessment/Plan: PMB Submucosal fibroids P) dx hysteroscopy, hysteroscopic resection of SM fibroids using myosure, D&C. Procedure explained. Risk of surgery reviewed including infection, bleeding, injury to surrounding organ structures, thermal injury, uterine perforation ( 06/998) and its risk, fluid overload and its mgmt. All ? Answered  Katrina Luna 03/07/2024, 6:19 AM

## 2024-03-08 ENCOUNTER — Encounter (HOSPITAL_COMMUNITY): Payer: Self-pay | Admitting: Obstetrics and Gynecology

## 2024-03-10 LAB — SURGICAL PATHOLOGY
